# Patient Record
Sex: Female | Born: 1940 | Race: White | Hispanic: No | Marital: Married | Smoking: Never smoker
Health system: Southern US, Community
[De-identification: ages and names within clinical notes are randomized; demographics above are authoritative.]

## PROBLEM LIST (undated history)

## (undated) DIAGNOSIS — M1990 Unspecified osteoarthritis, unspecified site: Secondary | ICD-10-CM

## (undated) DIAGNOSIS — L409 Psoriasis, unspecified: Secondary | ICD-10-CM

## (undated) DIAGNOSIS — M069 Rheumatoid arthritis, unspecified: Secondary | ICD-10-CM

## (undated) HISTORY — PX: ABDOMINAL HYSTERECTOMY: SHX81

---

## 2015-05-13 ENCOUNTER — Emergency Department: Payer: Medicare Other

## 2015-05-13 ENCOUNTER — Encounter: Payer: Self-pay | Admitting: Emergency Medicine

## 2015-05-13 ENCOUNTER — Observation Stay
Admission: EM | Admit: 2015-05-13 | Discharge: 2015-05-15 | Disposition: A | Discharge status: Skilled Nursing Facility | Payer: Medicare Other | Attending: Internal Medicine | Admitting: Internal Medicine

## 2015-05-13 DIAGNOSIS — Z885 Allergy status to narcotic agent status: Secondary | ICD-10-CM | POA: Insufficient documentation

## 2015-05-13 DIAGNOSIS — R531 Weakness: Secondary | ICD-10-CM

## 2015-05-13 DIAGNOSIS — Z79899 Other long term (current) drug therapy: Secondary | ICD-10-CM | POA: Diagnosis not present

## 2015-05-13 DIAGNOSIS — M25562 Pain in left knee: Secondary | ICD-10-CM | POA: Diagnosis not present

## 2015-05-13 DIAGNOSIS — M1990 Unspecified osteoarthritis, unspecified site: Secondary | ICD-10-CM | POA: Diagnosis not present

## 2015-05-13 DIAGNOSIS — M79605 Pain in left leg: Secondary | ICD-10-CM | POA: Insufficient documentation

## 2015-05-13 DIAGNOSIS — Y92003 Bedroom of unspecified non-institutional (private) residence as the place of occurrence of the external cause: Secondary | ICD-10-CM | POA: Diagnosis not present

## 2015-05-13 DIAGNOSIS — R0602 Shortness of breath: Secondary | ICD-10-CM | POA: Diagnosis not present

## 2015-05-13 DIAGNOSIS — R52 Pain, unspecified: Secondary | ICD-10-CM | POA: Diagnosis present

## 2015-05-13 DIAGNOSIS — Z8249 Family history of ischemic heart disease and other diseases of the circulatory system: Secondary | ICD-10-CM | POA: Insufficient documentation

## 2015-05-13 DIAGNOSIS — R05 Cough: Secondary | ICD-10-CM | POA: Diagnosis not present

## 2015-05-13 DIAGNOSIS — M8580 Other specified disorders of bone density and structure, unspecified site: Secondary | ICD-10-CM | POA: Diagnosis not present

## 2015-05-13 DIAGNOSIS — S92315A Nondisplaced fracture of first metatarsal bone, left foot, initial encounter for closed fracture: Secondary | ICD-10-CM | POA: Diagnosis not present

## 2015-05-13 DIAGNOSIS — S82302A Unspecified fracture of lower end of left tibia, initial encounter for closed fracture: Secondary | ICD-10-CM | POA: Insufficient documentation

## 2015-05-13 DIAGNOSIS — M069 Rheumatoid arthritis, unspecified: Secondary | ICD-10-CM | POA: Diagnosis not present

## 2015-05-13 DIAGNOSIS — N390 Urinary tract infection, site not specified: Secondary | ICD-10-CM | POA: Diagnosis present

## 2015-05-13 DIAGNOSIS — J189 Pneumonia, unspecified organism: Secondary | ICD-10-CM | POA: Insufficient documentation

## 2015-05-13 DIAGNOSIS — L409 Psoriasis, unspecified: Secondary | ICD-10-CM | POA: Insufficient documentation

## 2015-05-13 DIAGNOSIS — S72402A Unspecified fracture of lower end of left femur, initial encounter for closed fracture: Secondary | ICD-10-CM | POA: Insufficient documentation

## 2015-05-13 DIAGNOSIS — W19XXXA Unspecified fall, initial encounter: Secondary | ICD-10-CM | POA: Insufficient documentation

## 2015-05-13 DIAGNOSIS — S82209A Unspecified fracture of shaft of unspecified tibia, initial encounter for closed fracture: Secondary | ICD-10-CM | POA: Diagnosis present

## 2015-05-13 DIAGNOSIS — Z90710 Acquired absence of both cervix and uterus: Secondary | ICD-10-CM | POA: Insufficient documentation

## 2015-05-13 DIAGNOSIS — S92355A Nondisplaced fracture of fifth metatarsal bone, left foot, initial encounter for closed fracture: Secondary | ICD-10-CM | POA: Insufficient documentation

## 2015-05-13 DIAGNOSIS — M7989 Other specified soft tissue disorders: Secondary | ICD-10-CM

## 2015-05-13 DIAGNOSIS — S92309A Fracture of unspecified metatarsal bone(s), unspecified foot, initial encounter for closed fracture: Secondary | ICD-10-CM

## 2015-05-13 DIAGNOSIS — S82202A Unspecified fracture of shaft of left tibia, initial encounter for closed fracture: Secondary | ICD-10-CM

## 2015-05-13 HISTORY — DX: Unspecified osteoarthritis, unspecified site: M19.90

## 2015-05-13 HISTORY — DX: Psoriasis, unspecified: L40.9

## 2015-05-13 HISTORY — DX: Rheumatoid arthritis, unspecified: M06.9

## 2015-05-13 LAB — CBC WITH DIFFERENTIAL/PLATELET
BASOS ABS: 0.1 10*3/uL (ref 0–0.1)
BASOS PCT: 1 %
Eosinophils Absolute: 0.4 10*3/uL (ref 0–0.7)
Eosinophils Relative: 4 %
HEMATOCRIT: 35.9 % (ref 35.0–47.0)
HEMOGLOBIN: 11.3 g/dL — AB (ref 12.0–16.0)
LYMPHS PCT: 27 %
Lymphs Abs: 3 10*3/uL (ref 1.0–3.6)
MCH: 29.6 pg (ref 26.0–34.0)
MCHC: 31.5 g/dL — ABNORMAL LOW (ref 32.0–36.0)
MCV: 94.1 fL (ref 80.0–100.0)
Monocytes Absolute: 1.1 10*3/uL — ABNORMAL HIGH (ref 0.2–0.9)
Monocytes Relative: 10 %
Neutro Abs: 6.6 10*3/uL — ABNORMAL HIGH (ref 1.4–6.5)
Neutrophils Relative %: 58 %
Platelets: 240 10*3/uL (ref 150–440)
RBC: 3.81 MIL/uL (ref 3.80–5.20)
RDW: 16.6 % — AB (ref 11.5–14.5)
WBC: 11.3 10*3/uL — AB (ref 3.6–11.0)

## 2015-05-13 LAB — COMPREHENSIVE METABOLIC PANEL
ALBUMIN: 3.3 g/dL — AB (ref 3.5–5.0)
ALK PHOS: 132 U/L — AB (ref 38–126)
ALT: 20 U/L (ref 14–54)
AST: 28 U/L (ref 15–41)
Anion gap: 7 (ref 5–15)
BILIRUBIN TOTAL: 0.5 mg/dL (ref 0.3–1.2)
BUN: 18 mg/dL (ref 6–20)
CALCIUM: 8.8 mg/dL — AB (ref 8.9–10.3)
CHLORIDE: 109 mmol/L (ref 101–111)
CO2: 23 mmol/L (ref 22–32)
Creatinine, Ser: 0.96 mg/dL (ref 0.44–1.00)
GFR calc Af Amer: >60 mL/min (ref >60)
GFR, EST NON AFRICAN AMERICAN: 57 mL/min — AB (ref >60)
Glucose, Bld: 120 mg/dL — ABNORMAL HIGH (ref 65–99)
POTASSIUM: 4.2 mmol/L (ref 3.5–5.1)
Sodium: 139 mmol/L (ref 135–145)
TOTAL PROTEIN: 7.1 g/dL (ref 6.5–8.1)

## 2015-05-13 LAB — URINALYSIS COMPLETE WITH MICROSCOPIC (ARMC ONLY)
Bilirubin Urine: NEGATIVE
Glucose, UA: NEGATIVE mg/dL
Hgb urine dipstick: NEGATIVE
NITRITE: NEGATIVE
PH: 5 (ref 5.0–8.0)
Protein, ur: NEGATIVE mg/dL
SPECIFIC GRAVITY, URINE: 1.01 (ref 1.005–1.030)

## 2015-05-13 LAB — TROPONIN I: TROPONIN I: 0.03 ng/mL (ref <0.031)

## 2015-05-13 MED ORDER — LEVOFLOXACIN IN D5W 750 MG/150ML IV SOLN: 750.0000 mg | INTRAVENOUS | Status: DC

## 2015-05-13 MED ORDER — ACETAMINOPHEN 650 MG RE SUPP: 650.0000 mg | Freq: Four times a day (QID) | RECTAL | Status: DC | PRN

## 2015-05-13 MED ORDER — ONDANSETRON HCL 4 MG/2ML IJ SOLN
4.0000 mg | Freq: Four times a day (QID) | INTRAMUSCULAR | Status: DC | PRN
  Administered 2015-05-14: 4 mg via INTRAVENOUS
  Filled 2015-05-13: qty 2

## 2015-05-13 MED ORDER — HYDROMORPHONE HCL 1 MG/ML IJ SOLN
1.0000 mg | INTRAMUSCULAR | Status: DC | PRN
  Administered 2015-05-13 – 2015-05-15 (×7): 1 mg via INTRAVENOUS
  Filled 2015-05-13 (×7): qty 1

## 2015-05-13 MED ORDER — HYDROMORPHONE HCL 1 MG/ML IJ SOLN
INTRAMUSCULAR | Status: AC
  Administered 2015-05-13: 0.5 mg via INTRAVENOUS
  Filled 2015-05-13: qty 1

## 2015-05-13 MED ORDER — ONDANSETRON HCL 4 MG PO TABS: 4.0000 mg | ORAL_TABLET | Freq: Four times a day (QID) | ORAL | Status: DC | PRN

## 2015-05-13 MED ORDER — SODIUM CHLORIDE 0.9 % IV BOLUS (SEPSIS)
1000.0000 mL | Freq: Once | INTRAVENOUS | Status: AC
  Administered 2015-05-13: 1000 mL via INTRAVENOUS

## 2015-05-13 MED ORDER — LEVOFLOXACIN IN D5W 750 MG/150ML IV SOLN
INTRAVENOUS | Status: AC
  Administered 2015-05-13: 750 mg via INTRAVENOUS
  Filled 2015-05-13: qty 150

## 2015-05-13 MED ORDER — LEVOFLOXACIN IN D5W 750 MG/150ML IV SOLN
750.0000 mg | INTRAVENOUS | Status: DC
  Administered 2015-05-13: 750 mg via INTRAVENOUS

## 2015-05-13 MED ORDER — HYDROMORPHONE HCL 1 MG/ML IJ SOLN
0.5000 mg | Freq: Once | INTRAMUSCULAR | Status: AC
  Administered 2015-05-13: 0.5 mg via INTRAVENOUS

## 2015-05-13 MED ORDER — ACETAMINOPHEN 325 MG PO TABS: 650.0000 mg | ORAL_TABLET | Freq: Four times a day (QID) | ORAL | Status: DC | PRN

## 2015-05-13 MED ORDER — ENOXAPARIN SODIUM 40 MG/0.4ML ~~LOC~~ SOLN
40.0000 mg | SUBCUTANEOUS | Status: DC
  Administered 2015-05-13 – 2015-05-14 (×2): 40 mg via SUBCUTANEOUS
  Filled 2015-05-13 (×2): qty 0.4

## 2015-05-13 MED ORDER — IPRATROPIUM-ALBUTEROL 0.5-2.5 (3) MG/3ML IN SOLN: 3.0000 mL | RESPIRATORY_TRACT | Status: DC | PRN

## 2015-05-13 NOTE — ED Provider Notes (Signed)
Patient unable to ambulate, cannot go home. We'll need skilled rehabilitation placement. Social work will be consult, patient be started on antibiotics treatment UTI.Medical screening examination/treatment/procedure(s) were performed by non-physician practitioner and as supervising physician I was immediately available for consultation/collaboration.    Emily Filbert, MD 05/13/15 1900

## 2015-05-13 NOTE — ED Notes (Signed)
Obtained specimen by in and out cath.  Did complete bed change and cleaned patient

## 2015-05-13 NOTE — ED Notes (Signed)
Patient transported to CT 

## 2015-05-13 NOTE — ED Notes (Signed)
Assisted patient with using the bathroom

## 2015-05-13 NOTE — ED Notes (Signed)
Assisted patient with bedpan 

## 2015-05-13 NOTE — ED Notes (Signed)
Ems pt from home , pt slipped , fell injurying left foot and left knee outward rotation noted to left foot , pt denies left hip pain , pt received a total of fentanyl IV , and 4 mg IV zofran . Pt with increased pain on palpation to left foot and knee

## 2015-05-13 NOTE — H&P (Signed)
[facility] [place] at [facility]   PATIENT NAME: Shirley Howell    MR#:  [id number]  DATE OF BIRTH:  1940/12/18  DATE OF ADMISSION:  05/13/2015  PRIMARY CARE PHYSICIAN: Thea Alken, MD   REQUESTING/REFERRING PHYSICIAN: Dr. Daryel November  CHIEF COMPLAINT:   Chief Complaint  Patient presents with  . Fall   status post recent fall and left tibial fracture and difficulty walking  HISTORY OF PRESENT ILLNESS:  Shirley Howell  is a 74 y.o. female with a known history of osteoarthritis, rheumatoid arthritis,  psoriasis presented to the hospital after suffering a mechanical fall in her bedroom earlier today and was having significant left lower extremity pain and therefore came to the ER for further evaluation. In the emergency room patient underwent x-ray of her left leg including a CT scan which showed evidence of a left-sided tibial fracture, multiple left foot metatarsal fractures. Patient is having difficulty ambulating and therefore is being admitted to hospital for further evaluation. Incidentally patient has also had a cough with a productive sputum which is green and yellow in color. Patient has been on a Z-Pak and finished it about a week ago without much improvement in his symptoms. On x-ray patient is noted to have [place] superimposed pneumonia.  PAST MEDICAL HISTORY:   Past Medical History  Diagnosis Date  . Arthritis   . Rheumatoid arthritis   . Psoriasis     PAST SURGICAL HISTORY:   Past Surgical History  Procedure Laterality Date  . Abdominal hysterectomy      SOCIAL HISTORY:   History  Substance Use Topics  . Smoking status: Never Smoker   . Smokeless tobacco: Not on file  . Alcohol Use: No    FAMILY HISTORY:   Family History  Problem Relation Age of Onset  . Heart failure Mother   . Heart failure Father     DRUG ALLERGIES:   Allergies  Allergen Reactions  . Codeine Anxiety    REVIEW OF SYSTEMS:   Review of  Systems  Constitutional: Negative for fever and weight loss.  HENT: Negative for congestion, nosebleeds and tinnitus.   Eyes: Negative for blurred vision, double vision and redness.  Respiratory: Positive for cough, sputum production (green-yellow in color) and wheezing. Negative for hemoptysis and shortness of breath.   Cardiovascular: Negative for chest pain, orthopnea, leg swelling and PND.  Gastrointestinal: Negative for nausea, vomiting, abdominal pain, diarrhea and melena.  Genitourinary: Negative for dysuria, urgency and hematuria.  Musculoskeletal: Negative for joint pain and falls.  Neurological: Negative for dizziness, tingling, sensory change, focal weakness, seizures, weakness and headaches.  Endo/Heme/Allergies: Negative for polydipsia. Does not bruise/bleed easily.  Psychiatric/Behavioral: Negative for depression and memory loss. The patient is not nervous/anxious.     MEDICATIONS AT HOME:   Prior to Admission medications   Not on File   Med Rec. Is not done and will reorder home meds once it has been completed.     VITAL SIGNS:  Blood pressure 138/59, pulse 86, temperature 98.4 F (36.9 C), temperature source Oral, resp. rate 20, height 5' (1.524 m), weight 80.287 kg (177 lb), SpO2 98 %.  PHYSICAL EXAMINATION:  Physical Exam  GENERAL:  74 y.o.-year-old patient lying in the bed with no acute distress.  EYES: Pupils equal, round, reactive to light and accommodation. No scleral icterus. Extraocular muscles intact.  HEENT: Head atraumatic, normocephalic. Oropharynx and nasopharynx clear. No oropharyngeal erythema, moist oral mucosa  NECK:  Supple, no jugular venous  distention. No thyroid enlargement, no tenderness.  LUNGS: Diffuse rhonchi, wheezing bilaterally. No dullness to percussion. No use of accessory muscles of respiration.  CARDIOVASCULAR: S1, S2 regular rate and rhythm. No murmurs, rubs, or gallops.  ABDOMEN: Soft, nontender, nondistended. Bowel sounds present.  No organomegaly or mass.  EXTREMITIES: No pedal edema, cyanosis, or clubbing. + 2 pedal & radial pulses b/l.  Left lower extremity in a cast NEUROLOGIC: Cranial nerves II through XII are intact. No focal Motor or sensory deficits appreciated b/l PSYCHIATRIC: The patient is alert and oriented x 3. Good affect.  SKIN: No obvious rash, lesion, or ulcer.   LABORATORY PANEL:   CBC  Recent Labs Lab 05/13/15 1715  WBC 11.3*  HGB 11.3*  HCT 35.9  PLT 240   ------------------------------------------------------------------------------------------------------------------  Chemistries   Recent Labs Lab 05/13/15 1715  NA 139  K 4.2  CL 109  CO2 23  GLUCOSE 120*  BUN 18  CREATININE 0.96  CALCIUM 8.8*  AST 28  ALT 20  ALKPHOS 132*  BILITOT 0.5   ------------------------------------------------------------------------------------------------------------------  Cardiac Enzymes  Recent Labs Lab 05/13/15 1715  TROPONINI 0.03   ------------------------------------------------------------------------------------------------------------------  RADIOLOGY:  Dg Chest 1 View  05/13/2015   CLINICAL DATA:  Cough for 2 weeks.  Shortness of breath.  EXAM: CHEST  1 VIEW  COMPARISON:  None.  FINDINGS: There is cardiomegaly. Pulmonary interstitium appears coarsened and distorted. Somewhat focal airspace opacity is identified in the right upper lung zone. No pneumothorax or pleural effusion is identified. Remote surgical neck fracture right humerus is noted.  IMPRESSION: Chronic interstitial change. Patchy airspace opacity right upper lung zone could be due to superimposed pneumonia. Recommend followup to clearing.   Electronically Signed   By: Drusilla Kanner M.D.   On: 05/13/2015 20:16   Dg Ankle Complete Left  05/13/2015   CLINICAL DATA:  Fall  EXAM: LEFT ANKLE COMPLETE - 3+ VIEW  COMPARISON:  None.  FINDINGS: Severe osteopenia. There is an oblique fracture which is minimally displaced in  the distal tibia, 5 cm above the tibial plafond. There is also a subtle fracture at the base of the first metatarsal. It is ill-defined and is across the epiphysis. There is a fracture through the base of the fifth metatarsal which traverses into its articulation with the cuboid. The edges are somewhat corticated in this is probably subacute or chronic.  IMPRESSION: Multiple fractures of indeterminate age involving the distal tibia, base of the first metatarsal, and base of the fifth metatarsal. Foot radiograph study may be helpful.  Osteopenia   Electronically Signed   By: Jolaine Click M.D.   On: 05/13/2015 13:38   Ct Femur Left Wo Contrast  05/13/2015   CLINICAL DATA:  Probable subacute or old impaction fracture of the distal left femur on radiographs earlier today. The patient hurt the left knee after falling.  EXAM: CT OF THE LEFT FEMUR WITHOUT CONTRAST  TECHNIQUE: Multidetector CT imaging was performed according to the standard protocol. Multiplanar CT image reconstructions were also generated.  COMPARISON:  Left knee radiographs obtained earlier today.  FINDINGS: Marked diffuse osteopenia. No fracture or dislocation seen. Left knee tricompartmental degenerative changes. Small to moderate-sized knee effusion. Left lateral subcutaneous edema in the distal thigh. The posterior cruciate ligament appears intact. The anterior cruciate ligament is not adequately visualized. Atheromatous arterial calcifications are noted.  IMPRESSION: 1. No fracture or dislocation seen. 2. Small to moderate-sized knee joint effusion. 3. Left knee tricompartmental degenerative changes. 4. Left lateral subcutaneous edema  in the distal thigh. 5. Marked diffuse osteopenia.   Electronically Signed   By: Beckie Salts M.D.   On: 05/13/2015 17:15   Dg Knee Complete 4 Views Left  05/13/2015   CLINICAL DATA:  Status post fall today. Left knee pain. Initial encounter.  EXAM: LEFT KNEE - COMPLETE 4+ VIEW  COMPARISON:  None.  FINDINGS: There  is an age-indeterminate impaction fracture of the distal femur. The fracture appears subacute to remote. No other fracture is identified. Degenerative change about the knee appears worst in medial compartment. There is no joint effusion. Patella alta is noted.  IMPRESSION: Mild impaction fracture of the distal left femur appears subacute to remote.  Osteopenia.  Osteoarthritis most notable medially.  Patella alta.   Electronically Signed   By: Drusilla Kanner M.D.   On: 05/13/2015 14:14   Dg Foot Complete Left  05/13/2015   CLINICAL DATA:  Status post fall today. Left foot pain. Initial encounter.  EXAM: LEFT FOOT - COMPLETE 3+ VIEW  COMPARISON:  None.  FINDINGS: Bones are severely osteopenic. There is a nondisplaced fracture at the base of the fifth metatarsal. Fracture of the base of first metatarsal is also identified. There may also be fractures through the necks of the second and third metatarsals.  IMPRESSION: Nondisplaced fractures of the bases of the first and fifth metatarsals and likely fractures of the necks of the second and third metatarsals are age indeterminate.  Severe osteopenia.   Electronically Signed   By: Drusilla Kanner M.D.   On: 05/13/2015 14:12     IMPRESSION AND PLAN:   74 year old female with past medical history of psoriasis, osteoarthritis, rheumatoid arthritis who presents to the hospital after suffering a mechanical fall and noted to have a left tibial and multiple metatarsal fractures on the left foot. Incidentally patient was also noted to have a pneumonia.  #1 left foot metatarsal fractures/left tibial fracture-this is likely secondary to the mechanical fall. We'll get orthopedic consult. Patient's leg is already in a cast. Pain control with as needed Dilaudid. We'll get physical therapy evaluation and patient may likely need short-term rehabilitation.  #2 pneumonia-patient has had a cough with productive sputum now for about a week and has finished a Z-Pak  recently. Chest x-ray on admission showing a possible right upper lobe airspace disease consistent with pneumonia. We'll start the patient on IV Levaquin and follow sputum cultures. She is afebrile and hemodynamically stable.  #3 history of rheumatoid arthritis/psoriasis-we'll resume patient's home meds once the medication reconciliation is done.    All the records are reviewed and case discussed with ED provider. Management plans discussed with the patient, family and they are in agreement.  CODE STATUS: Full  TOTAL TIME TAKING CARE OF THIS PATIENT: 50 minutes.    Houston Siren M.D on 05/13/2015 at 8:33 PM  Between 7am to 6pm - Pager - [phone]  After 6pm go to [email or web address] - password [email or web address] [facility]  [facility]  Office  [phone]  CC: Primary care physician; Thea Alken, MD

## 2015-05-13 NOTE — ED Notes (Signed)
MD at bedside. 

## 2015-05-13 NOTE — ED Notes (Signed)
X-ray at bedside

## 2015-05-13 NOTE — ED Provider Notes (Signed)
[facility] Emergency Department Provider Note  ____________________________________________  Time seen: Approximately 12:36 PM  I have reviewed the triage vital signs and the nursing notes.   HISTORY  Chief Complaint Fall    HPI Shirley Howell is a 74 y.o. female who slipped on carpet and fell this morning. She states her right foot slipped and and her left foot and leg bent backward, and then she landed on the bent foot. She is also complaining of pain to the left knee.   Past Medical History  Diagnosis Date  . Arthritis     There are no active problems to display for this patient.   Past Surgical History  Procedure Laterality Date  . Abdominal hysterectomy      No current outpatient prescriptions on file.  Allergies Codeine  No family history on file.  Social History History  Substance Use Topics  . Smoking status: Never Smoker   . Smokeless tobacco: Not on file  . Alcohol Use: Not on file    Review of Systems Constitutional: No recent illness. Eyes: No visual changes. ENT: No sore throat. Cardiovascular: Denies chest pain or palpitations. Respiratory: Denies shortness of breath. Gastrointestinal: No abdominal pain.  Genitourinary: Negative for dysuria. Musculoskeletal: Pain in left foot, ankle, and knee. Skin: Negative for rash. Neurological: Negative for headaches, focal weakness or numbness. 10-point ROS otherwise negative.  ____________________________________________   PHYSICAL EXAM:  VITAL SIGNS: ED Triage Vitals  Enc Vitals Group     BP 05/13/15 1230 179/79 mmHg     Pulse Rate 05/13/15 1230 79     Resp 05/13/15 1230 20     Temp 05/13/15 1230 97.9 F (36.6 C)     Temp Source 05/13/15 1230 Oral     SpO2 05/13/15 1226 98 %     Weight 05/13/15 1230 177 lb (80.287 kg)     Height 05/13/15 1230 5' (1.524 m)     Head Cir --      Peak Flow --      Pain Score 05/13/15 1231 10     Pain Loc --      Pain Edu? --      Excl. in GC? --     Constitutional: Alert and oriented. Well appearing and in no acute distress. Eyes: Conjunctivae are normal. EOMI. Head: Atraumatic. Nose: No congestion/rhinnorhea. Neck: No stridor.  Respiratory: Normal respiratory effort.   Musculoskeletal: Obvious deformity to left ankle. Tenderness to palpation over proximal fibula. Neurologic:  Normal speech and language. No gross focal neurologic deficits are appreciated. Speech is normal. No gait instability. Skin:  Skin is warm, dry and intact. Atraumatic. No open lesions noted Psychiatric: Mood and affect are normal. Speech and behavior are normal.  ____________________________________________   LABS (all labs ordered are listed, but only abnormal results are displayed)  Labs Reviewed - No data to display ____________________________________________  RADIOLOGY  Nondisplaced fractures of the first and fifth metatarsal, questionable fractures through the next of the second and third metatarsal; impaction fracture of the distal femur is present, possibly subacute; multiple fractures of the distal tibia. ____________________________________________   PROCEDURES  Procedure(s) performed: SPLINT APPLICATION Date/Time: 7:14 PM Authorized by: Kem Boroughs Consent: Verbal consent obtained. Risks and benefits: risks, benefits and alternatives were discussed Consent given by: patient Splint applied XI:PJASN, tech Location details: left posterior toes to mid calf Splint type: OCL Supplies used: OCL/Ace Post-procedure: The splinted body part was neurovascularly unchanged following the procedure. Patient tolerance: Patient tolerated the procedure well with  no immediate complications.      ____________________________________________   INITIAL IMPRESSION / ASSESSMENT AND PLAN / ED COURSE  Pertinent labs & imaging results that were available during my care of the patient were reviewed by me and considered in my  medical decision making (see chart for details).  Will manage pain and await x-ray results. ----------------------------------------- 3:30 PM on 05/13/2015 -----------------------------------------  X-rays viewed by me. Discussed with Dr. Hyacinth Meeker. Awaiting return call for plan and disposition.  3:56 PM  Plan to CT to get better look at femur/confirm fracture. Will apply posterior ocl to toes/ankle. ----------------------------------------- 7:18 PM on 05/13/2015 -----------------------------------------  Patient and family in room. Discussed the possibility of being discharged home. Patient husband, and other family are unable to care for her at home. She will need to stay in the hospital for skilled nursing and rehabilitation placement.     ____________________________________________   FINAL CLINICAL IMPRESSION(S) / ED DIAGNOSES  Final diagnoses:  Pain and swelling of left lower extremity       Chinita Pester, FNP 05/13/15 1921  Governor Rooks, MD 05/14/15 1447

## 2015-05-13 NOTE — ED Notes (Signed)
Family at bedside. 

## 2015-05-13 NOTE — Progress Notes (Signed)
Pts. Home meds are in pharmacy.

## 2015-05-14 LAB — CBC
HCT: 34.5 % — ABNORMAL LOW (ref 35.0–47.0)
Hemoglobin: 11.2 g/dL — ABNORMAL LOW (ref 12.0–16.0)
MCH: 30.7 pg (ref 26.0–34.0)
MCHC: 32.6 g/dL (ref 32.0–36.0)
MCV: 94.2 fL (ref 80.0–100.0)
Platelets: 222 10*3/uL (ref 150–440)
RBC: 3.66 MIL/uL — ABNORMAL LOW (ref 3.80–5.20)
RDW: 15.8 % — ABNORMAL HIGH (ref 11.5–14.5)
WBC: 8.7 10*3/uL (ref 3.6–11.0)

## 2015-05-14 LAB — BASIC METABOLIC PANEL
ANION GAP: 5 (ref 5–15)
BUN: 14 mg/dL (ref 6–20)
CALCIUM: 8.3 mg/dL — AB (ref 8.9–10.3)
CO2: 24 mmol/L (ref 22–32)
Chloride: 109 mmol/L (ref 101–111)
Creatinine, Ser: 0.83 mg/dL (ref 0.44–1.00)
GFR calc Af Amer: >60 mL/min (ref >60)
GFR calc non Af Amer: >60 mL/min (ref >60)
GLUCOSE: 115 mg/dL — AB (ref 65–99)
Potassium: 4.3 mmol/L (ref 3.5–5.1)
SODIUM: 138 mmol/L (ref 135–145)

## 2015-05-14 MED ORDER — LEVOFLOXACIN IN D5W 750 MG/150ML IV SOLN
750.0000 mg | INTRAVENOUS | Status: DC
  Administered 2015-05-14: 750 mg via INTRAVENOUS
  Filled 2015-05-14 (×3): qty 150

## 2015-05-14 NOTE — Progress Notes (Signed)
[facility] - [place] at [facility]                                                                                                                                                                                            Patient Demographics   Shirley Howell, is a 74 y.o. female, DOB - [birth year], YTK:[id number]  Admit date - 05/13/2015   Admitting Physician Houston Siren, MD  Outpatient Primary MD for the patient is Thea Alken, MD   LOS -   Subjective: Patient still has significant pain in the left leg, continues to have complaint of cough. And some shortness of breath     Review of Systems:   CONSTITUTIONAL: No documented fever. No fatigue, weakness. No weight gain, no weight loss.  EYES: No blurry or double vision.  ENT: No tinnitus. No postnasal drip. No redness of the oropharynx.  RESPIRATORY: positive cough, no wheeze, no hemoptysis. possive dyspnea.  CARDIOVASCULAR: No chest pain. No orthopnea. No palpitations. No syncope.  GASTROINTESTINAL: No nausea, no vomiting or diarrhea. No abdominal pain. No melena or hematochezia.  GENITOURINARY: No dysuria or hematuria.  ENDOCRINE: No polyuria or nocturia. No heat or cold intolerance.  HEMATOLOGY: No anemia. No bruising. No bleeding.  INTEGUMENTARY: No rashes. No lesions.  MUSCULOSKELETAL: No arthritis. No swelling. No gout. Pain in the left foot NEUROLOGIC: No numbness, tingling, or ataxia. No seizure-type activity.  PSYCHIATRIC: No anxiety. No insomnia. No ADD.    Vitals:   Filed Vitals:   05/13/15 2027 05/13/15 2152 05/14/15 0444 05/14/15 0750  BP: 138/59 144/57 132/59 150/64  Pulse: 86 78 78 80  Temp: 98.4 F (36.9 C) 97.4 F (36.3 C) 98.4 F (36.9 C) 98.3 F (36.8 C)  TempSrc: Oral Oral Oral Oral  Resp: 20 18 18 18   Height:      Weight:      SpO2: 98% 98% 94% 98%    Wt Readings from Last 3 Encounters:  05/13/15 80.287 kg (177 lb)    No intake or output data in the 24  hours ending 05/14/15 1213  Physical Exam:   GENERAL: Pleasant-appearing in no apparent distress.  HEAD, EYES, EARS, NOSE AND THROAT: Atraumatic, normocephalic. Extraocular muscles are intact. Pupils equal and reactive to light. Sclerae anicteric. No conjunctival injection. No oro-pharyngeal erythema.  NECK: Supple. There is no jugular venous distention. No bruits, no lymphadenopathy, no thyromegaly.  HEART: Regular rate and rhythm, tachycardic. No murmurs, no rubs, no clicks.  LUNGS: Clear to auscultation bilaterally. No rales or rhonchi. No wheezes.  ABDOMEN: Soft, flat, nontender, nondistended. Has good bowel sounds. No hepatosplenomegaly  appreciated.  EXTREMITIES: No evidence of any cyanosis, clubbing, or peripheral edema.  +2 pedal and radial pulses bilaterally.  NEUROLOGIC: The patient is alert, awake, and oriented x3 with no focal motor or sensory deficits appreciated bilaterally.  SKIN: Moist and warm with no rashes appreciated.  Psych: Not anxious, depressed LN: No inguinal LN enlargement    Antibiotics   Anti-infectives    Start     Dose/Rate Route Frequency Ordered Stop   05/15/15 1800  levofloxacin (LEVAQUIN) IVPB 750 mg  Status:  Discontinued     750 mg 100 mL/hr over 90 Minutes Intravenous Every 48 hours 05/13/15 2240 05/14/15 1029   05/14/15 2100  levofloxacin (LEVAQUIN) IVPB 750 mg     750 mg 100 mL/hr over 90 Minutes Intravenous Every 24 hours 05/14/15 1029     05/13/15 2045  levofloxacin (LEVAQUIN) IVPB 750 mg  Status:  Discontinued     750 mg 100 mL/hr over 90 Minutes Intravenous Every 24 hours 05/13/15 2035 05/13/15 2240      Medications   Scheduled Meds: . enoxaparin (LOVENOX) injection  40 mg Subcutaneous Q24H  . levofloxacin (LEVAQUIN) IV  750 mg Intravenous Q24H   Continuous Infusions:  PRN Meds:.acetaminophen **OR** acetaminophen, HYDROmorphone (DILAUDID) injection, ipratropium-albuterol, ondansetron **OR** ondansetron (ZOFRAN) IV   Data Review:    Micro Results No results found for this or any previous visit (from the past 240 hour(s)).  Radiology Reports Dg Chest 1 View  05/13/2015   CLINICAL DATA:  Cough for 2 weeks.  Shortness of breath.  EXAM: CHEST  1 VIEW  COMPARISON:  None.  FINDINGS: There is cardiomegaly. Pulmonary interstitium appears coarsened and distorted. Somewhat focal airspace opacity is identified in the right upper lung zone. No pneumothorax or pleural effusion is identified. Remote surgical neck fracture right humerus is noted.  IMPRESSION: Chronic interstitial change. Patchy airspace opacity right upper lung zone could be due to superimposed pneumonia. Recommend followup to clearing.   Electronically Signed   By: Drusilla Kanner M.D.   On: 05/13/2015 20:16   Dg Ankle Complete Left  05/13/2015   CLINICAL DATA:  Fall  EXAM: LEFT ANKLE COMPLETE - 3+ VIEW  COMPARISON:  None.  FINDINGS: Severe osteopenia. There is an oblique fracture which is minimally displaced in the distal tibia, 5 cm above the tibial plafond. There is also a subtle fracture at the base of the first metatarsal. It is ill-defined and is across the epiphysis. There is a fracture through the base of the fifth metatarsal which traverses into its articulation with the cuboid. The edges are somewhat corticated in this is probably subacute or chronic.  IMPRESSION: Multiple fractures of indeterminate age involving the distal tibia, base of the first metatarsal, and base of the fifth metatarsal. Foot radiograph study may be helpful.  Osteopenia   Electronically Signed   By: Jolaine Click M.D.   On: 05/13/2015 13:38   Ct Femur Left Wo Contrast  05/13/2015   CLINICAL DATA:  Probable subacute or old impaction fracture of the distal left femur on radiographs earlier today. The patient hurt the left knee after falling.  EXAM: CT OF THE LEFT FEMUR WITHOUT CONTRAST  TECHNIQUE: Multidetector CT imaging was performed according to the standard protocol. Multiplanar CT image  reconstructions were also generated.  COMPARISON:  Left knee radiographs obtained earlier today.  FINDINGS: Marked diffuse osteopenia. No fracture or dislocation seen. Left knee tricompartmental degenerative changes. Small to moderate-sized knee effusion. Left lateral subcutaneous edema in the distal thigh.  The posterior cruciate ligament appears intact. The anterior cruciate ligament is not adequately visualized. Atheromatous arterial calcifications are noted.  IMPRESSION: 1. No fracture or dislocation seen. 2. Small to moderate-sized knee joint effusion. 3. Left knee tricompartmental degenerative changes. 4. Left lateral subcutaneous edema in the distal thigh. 5. Marked diffuse osteopenia.   Electronically Signed   By: Beckie Salts M.D.   On: 05/13/2015 17:15   Dg Knee Complete 4 Views Left  05/13/2015   CLINICAL DATA:  Status post fall today. Left knee pain. Initial encounter.  EXAM: LEFT KNEE - COMPLETE 4+ VIEW  COMPARISON:  None.  FINDINGS: There is an age-indeterminate impaction fracture of the distal femur. The fracture appears subacute to remote. No other fracture is identified. Degenerative change about the knee appears worst in medial compartment. There is no joint effusion. Patella alta is noted.  IMPRESSION: Mild impaction fracture of the distal left femur appears subacute to remote.  Osteopenia.  Osteoarthritis most notable medially.  Patella alta.   Electronically Signed   By: Drusilla Kanner M.D.   On: 05/13/2015 14:14   Dg Foot Complete Left  05/13/2015   CLINICAL DATA:  Status post fall today. Left foot pain. Initial encounter.  EXAM: LEFT FOOT - COMPLETE 3+ VIEW  COMPARISON:  None.  FINDINGS: Bones are severely osteopenic. There is a nondisplaced fracture at the base of the fifth metatarsal. Fracture of the base of first metatarsal is also identified. There may also be fractures through the necks of the second and third metatarsals.  IMPRESSION: Nondisplaced fractures of the bases of the  first and fifth metatarsals and likely fractures of the necks of the second and third metatarsals are age indeterminate.  Severe osteopenia.   Electronically Signed   By: Drusilla Kanner M.D.   On: 05/13/2015 14:12     CBC  Recent Labs Lab 05/13/15 1715 05/14/15 0353  WBC 11.3* 8.7  HGB 11.3* 11.2*  HCT 35.9 34.5*  PLT 240 222  MCV 94.1 94.2  MCH 29.6 30.7  MCHC 31.5* 32.6  RDW 16.6* 15.8*  LYMPHSABS 3.0  --   MONOABS 1.1*  --   EOSABS 0.4  --   BASOSABS 0.1  --     Chemistries   Recent Labs Lab 05/13/15 1715 05/14/15 0353  NA 139 138  K 4.2 4.3  CL 109 109  CO2 23 24  GLUCOSE 120* 115*  BUN 18 14  CREATININE 0.96 0.83  CALCIUM 8.8* 8.3*  AST 28  --   ALT 20  --   ALKPHOS 132*  --   BILITOT 0.5  --    ------------------------------------------------------------------------------------------------------------------ estimated creatinine clearance is 56.6 mL/min (by C-G formula based on Cr of 0.83). ------------------------------------------------------------------------------------------------------------------ No results for input(s): HGBA1C in the last 72 hours. ------------------------------------------------------------------------------------------------------------------ No results for input(s): CHOL, HDL, LDLCALC, TRIG, CHOLHDL, LDLDIRECT in the last 72 hours. ------------------------------------------------------------------------------------------------------------------ No results for input(s): TSH, T4TOTAL, T3FREE, THYROIDAB in the last 72 hours.  Invalid input(s): FREET3 ------------------------------------------------------------------------------------------------------------------ No results for input(s): VITAMINB12, FOLATE, FERRITIN, TIBC, IRON, RETICCTPCT in the last 72 hours.  Coagulation profile No results for input(s): INR, PROTIME in the last 168 hours.  No results for input(s): DDIMER in the last 72 hours.  Cardiac Enzymes  Recent  Labs Lab 05/13/15 1715  TROPONINI 0.03   ------------------------------------------------------------------------------------------------------------------ Invalid input(s): POCBNP    Assessment & Plan   74 year old female with past medical history of psoriasis, osteoarthritis, rheumatoid arthritis who presents to the hospital after suffering a mechanical fall  and noted to have a left tibial and multiple metatarsal fractures on the left foot. Incidentally patient was also noted to have a pneumonia.  #1 left foot metatarsal fractures/left tibial fracture-this is likely secondary to the mechanical fall. Orthopedic eval currently pending.  #2 pneumonia-patient has had a cough with productive sputum now for about a week and has finished a Z-Pak recently. Chest x-ray on admission showing a possible right upper lobe airspace disease consistent with pneumonia. Continue level Floxin  #3 history of rheumatoid arthritis/psoriasis-Enbrel on hold due to possible intervention     Code Status Orders        Start     Ordered   05/13/15 2159  Full code   Continuous     05/13/15 2158    Advance Directive Documentation        Most Recent Value   Type of Advance Directive  Healthcare Power of Attorney, Living will   Pre-existing out of facility DNR order (yellow form or pink MOST form)     "MOST" Form in Place?             Consults orthopedics DVT Prophylaxis  Lovenox   Lab Results  Component Value Date   PLT 222 05/14/2015     Time Spent in minutes 35 minutes    Auburn Bilberry M.D on 05/14/2015 at 12:13 PM  Between 7am to 6pm - Pager - [phone]  After 6pm go to [email or web address] - password [email or web address] [facility]  [facility]   Office  [phone]

## 2015-05-14 NOTE — Clinical Social Work Placement (Signed)
   CLINICAL SOCIAL WORK PLACEMENT  NOTE  Date:  05/14/2015  Patient Details  Name: Shirley Howell MRN: [id number] Date of Birth: Nov 12, 1941  Clinical Social Work is seeking post-discharge placement for this patient at the Skilled  Nursing Facility level of care (*CSW will initial, date and re-position this form in  chart as items are completed):  Yes   Patient/family provided with [place] Clinical Social Work Department's list of facilities offering this level of care within the geographic area requested by the patient (or if unable, by the patient's family).  Yes   Patient/family informed of their freedom to choose among providers that offer the needed level of care, that participate in Medicare, Medicaid or managed care program needed by the patient, have an available bed and are willing to accept the patient.  Yes   Patient/family informed of [facility]'s ownership interest in [facility] and [facility], as well as of the fact that they are under no obligation to receive care at these facilities.  PASRR submitted to EDS on       PASRR number received on       Existing PASRR number confirmed on 05/14/15     FL2 transmitted to all facilities in geographic area requested by pt/family on 05/14/15     FL2 transmitted to all facilities within larger geographic area on       Patient informed that his/her managed care company has contracts with or will negotiate with certain facilities, including the following:        Yes   Patient/family informed of bed offers received.  Patient chooses bed at [facility]     Physician recommends and patient chooses bed at      Patient to be transferred to   on  .  Patient to be transferred to facility by       Patient family notified on   of transfer.  Name of family member notified:        PHYSICIAN       Additional Comment:    _______________________________________________ Delight Stare,  LCSW 05/14/2015, 1:37 PM

## 2015-05-14 NOTE — Care Management Note (Signed)
Case Management Note  Patient Details  Name: Shirley Howell MRN: [id number] Date of Birth: December 09, 1940  Subjective/Objective:  RNCM assessment for discharge planning. Admitted with left tibia and multiple L metatarsal fractures after falling in her bedroom. HX: rheumatoid arthritis, osteoarthritis.  She has a walker, wheelchair and bedside commode at home. She reports she is dependent on her spouse for adls. Met with pt at bedside. She states she will need to go to rehab at discharge. Her spouse will be having back surgery on 05/23/2015 and she is unable to care for herself.  CSW updated. Following                Action/Plan:   Expected Discharge Date:                  Expected Discharge Plan:  [place]  In-House Referral:  Clinical Social Work  Discharge planning Services  CM Consult  Post Acute Care Choice:    Choice offered to:     DME Arranged:    DME Agency:     HH Arranged:    [place] Agency:     Status of Service:  In process, will continue to follow  Medicare Important Message Given:  Yes Date Medicare IM Given:  05/14/15 Medicare IM give by:  Or[place] July Date Additional Medicare IM Given:    Additional Medicare Important Message give by:     If discussed at [place] of Stay Meetings, dates discussed:    Additional Comments:  Jolly Mango, RN 05/14/2015, 10:28 AM

## 2015-05-14 NOTE — Clinical Social Work Placement (Signed)
   CLINICAL SOCIAL WORK PLACEMENT  NOTE  Date:  05/14/2015  Patient Details  Name: Shirley Howell MRN: [id number] Date of Birth: Jun 25, 1941  Clinical Social Work is seeking post-discharge placement for this patient at the Skilled  Nursing Facility level of care (*CSW will initial, date and re-position this form in  chart as items are completed):  Yes   Patient/family provided with [place] Clinical Social Work Department's list of facilities offering this level of care within the geographic area requested by the patient (or if unable, by the patient's family).  Yes   Patient/family informed of their freedom to choose among providers that offer the needed level of care, that participate in Medicare, Medicaid or managed care program needed by the patient, have an available bed and are willing to accept the patient.  Yes   Patient/family informed of [facility]'s ownership interest in [facility] and [facility], as well as of the fact that they are under no obligation to receive care at these facilities.  PASRR submitted to EDS on       PASRR number received on       Existing PASRR number confirmed on 05/14/15     FL2 transmitted to all facilities in geographic area requested by pt/family on 05/14/15     FL2 transmitted to all facilities within larger geographic area on       Patient informed that his/her managed care company has contracts with or will negotiate with certain facilities, including the following:            Patient/family informed of bed offers received.  Patient chooses bed at       Physician recommends and patient chooses bed at      Patient to be transferred to   on  .  Patient to be transferred to facility by       Patient family notified on   of transfer.  Name of family member notified:        PHYSICIAN       Additional Comment:    _______________________________________________ Delight Stare, LCSW 05/14/2015, 12:23 PM

## 2015-05-14 NOTE — Evaluation (Signed)
Physical Therapy Evaluation Patient Details Name: Shirley Howell MRN: [id number] DOB: 1941/06/19 Today's Date: 05/14/2015   History of Present Illness  Shirley Howell is a 74 y.o. female who complains of  left leg pain following a fall at home yesterday.  Brought to ER where exam and x-rays showed a short oblique distal left tibia fx and fxs of the 2nd and 3rd metatarsal heads. Left knee x-rays show probable old impacted distal femur fx.  Admitted for pneumonia and fx care and probable SNF care. At baseline pt reports independence with ADLs and ambulation at home with rolling walker. Her son assists with IADLs. She reports 3 falls in the last 12 months  Clinical Impression  Pt demonstrates high levels of pain which limit her participation with therapy. She requires considerable assist with all mobility and is unable to ambulate at this time. Pt will require SNF placement at discharge in order to return to full function at home. Pt will benefit from skilled PT services to address deficits in strength, balance, and mobility in order to return to full function at home.     Follow Up Recommendations SNF    Equipment Recommendations  None recommended by PT    Recommendations for Other Services       Precautions / Restrictions Precautions Precautions: Fall Restrictions Weight Bearing Restrictions: Yes LLE Weight Bearing: Non weight bearing      Mobility  Bed Mobility Overal bed mobility: Needs Assistance Bed Mobility: Supine to Sit;Sit to Supine     Supine to sit: Mod assist Sit to supine: Mod assist (+2 to scoot up to [facility])   General bed mobility comments: Pt with painful bed mobility with all AROM/PROM of LLE. Pt resists therapist initially with transfer. Care taken to protect LLE. Pt performs slowly  Transfers Overall transfer level: Needs assistance Equipment used: Rolling walker (2 wheeled) Transfers: Sit to/from Stand Sit to Stand: Mod assist         General transfer  comment: Pt requires cues for hand placement with transfer. Maintains LLE NWB during transfer. Pt with poor sequencing and poor LE power. Reports LLE pain with all attempts at transfer. Able to come to upright stance but unable to fully extens spine due to protecting LLE  Ambulation/Gait Ambulation/Gait assistance:  (Unable)              Stairs            Wheelchair Mobility    Modified Rankin (Stroke Patients Only)       Balance Overall balance assessment: Needs assistance Sitting-balance support: No upper extremity supported (R foot supported) Sitting balance-Leahy Scale: Fair     Standing balance support: Bilateral upper extremity supported Standing balance-Leahy Scale: Poor                               Pertinent Vitals/Pain Pain Assessment: 0-10 Pain Score: 8  Pain Location: L leg and foot. Pt also complains of R wrist pain at site of IV insertion    Home Living Family/patient expects to be discharged to:: Private residence Living Arrangements: Spouse/significant other Available Help at Discharge: Family (Husband having hip surgery in the next 2 weeks) Type of Home: House Home Access: Stairs to enter Entrance Stairs-Rails: Can reach both Entrance Stairs-Number of Steps: 3 Home Layout: One level Home Equipment: Walker - 2 wheels;Bedside commode;Shower seat - built in;Grab bars - toilet;Grab bars - tub/shower;Wheelchair - [employer] (Walk-in shower)  Prior Function Level of Independence: Needs assistance   Gait / Transfers Assistance Needed: Independent with rolling walker  ADL's / Homemaking Assistance Needed: Independent with ADLs, assist for IADLs from son        Hand Dominance        Extremity/Trunk Assessment   Upper Extremity Assessment: Overall WFL for tasks assessed [facility] but generalized weakness noted. Grossly 4/5 throughout)           Lower Extremity Assessment: LLE deficits/detail   LLE Deficits /  Details: RLE at least 4 to 4+/5 throughout. LLE: pt able to perform partial SLR and full LAQ without assist. Posterior splint on L lower leg. No resistance testing provided to LLE     Communication   Communication: No difficulties  Cognition Arousal/Alertness: Awake/alert Behavior During Therapy: WFL for tasks assessed/performed Overall Cognitive Status: Within Functional Limits for tasks assessed                      General Comments      Exercises        Assessment/Plan    PT Assessment Patient needs continued PT services  PT Diagnosis Generalized weakness;Acute pain;Difficulty walking   PT Problem List Decreased strength;Decreased range of motion;Decreased activity tolerance;Decreased balance;Decreased mobility;Decreased knowledge of use of DME;Pain  PT Treatment Interventions DME instruction;Gait training;Stair training;Therapeutic activities;Therapeutic exercise;Balance training;Functional mobility training;Neuromuscular re-education;Wheelchair mobility training   PT Goals (Current goals can be found in the Care Plan section) Acute Rehab PT Goals Patient Stated Goal: "I need help until I can move" PT Goal Formulation: With patient Time For Goal Achievement: 05/28/15 Potential to Achieve Goals: Good    Frequency 7X/week   Barriers to discharge        Co-evaluation               End of Session Equipment Utilized During Treatment: Gait belt Activity Tolerance: Patient limited by pain (Anxious) Patient left: in bed;with call bell/phone within reach;with bed alarm set (Pt refuses up to recliner) Nurse Communication: Other (comment) (Attempted to call but RN does not answer)         Time: 1525-1550 PT Time Calculation (min) (ACUTE ONLY): 25 min   Charges:   PT Evaluation $Initial PT Evaluation Tier I: 1 Procedure     PT G Codes:   PT G-Codes **NOT FOR INPATIENT CLASS** Functional Assessment Tool Used: Clinical Judgement Functional Limitation:  Mobility: Walking and moving around Mobility: Walking and Moving Around Current Status (H3716): At least 60 percent but less than 80 percent impaired, limited or restricted Mobility: Walking and Moving Around Goal Status [phone]): At least 20 percent but less than 40 percent impaired, limited or restricted   Lynnea Maizes PT, DPT   Huprich,Jason 05/14/2015, 4:10 PM

## 2015-05-14 NOTE — Progress Notes (Signed)
PT Hold Note  Patient Details Name: Tinzley Dalia MRN: [id number] DOB: 1941/06/12   Cancelled Treatment:    Reason Eval/Treat Not Completed: Medical issues which prohibited therapy (Awaiting orthopedic consult). Pt admitted with L tibia and multiple L metatarsal fractures. Chart reviewed and ortho consult pending. Will see patient once ortho has established and plan of care and LLE WB status.  Sharalyn Ink Daron Breeding PT, DPT   Monchel Pollitt 05/14/2015, 9:51 AM

## 2015-05-14 NOTE — Progress Notes (Signed)
Pts husband took home her 4 medications. Fluoxetine 20mg , Folic acid 1mg , Omeprazole 20mg , Diazepam 5mg .

## 2015-05-14 NOTE — Clinical Social Work Note (Signed)
Clinical Social Work Assessment  Patient Details  Name: Shirley Howell MRN: [id number] Date of Birth: 07-06-1941  Date of referral:  05/14/15               Reason for consult:  Facility Placement                Permission sought to share information with:  Family Supports Permission granted to share information::  Yes, Verbal Permission Granted  Name::     Od Dennington  Agency::     Relationship::  husband  Contact Information:  [phone]  Housing/Transportation Living arrangements for the past 2 months:  Single Family Home Source of Information:  Patient, Spouse Patient Interpreter Needed:  None Criminal Activity/Legal Involvement Pertinent to Current Situation/Hospitalization:  No - Comment as needed Significant Relationships:  Adult Children, Spouse Lives with:  Spouse, Adult Children, Self Do you feel safe going back to the place where you live?  Yes Need for family participation in patient care:  Yes (Comment)  Care giving concerns:  Pt will need assistance with ambulation due to fracture.   Social Worker assessment / plan:  Pt is a 74 y/o married female who currently lives in a private home with her husband and older son.  Pt has a younger son who lives in a house near pt's home.  Pt and husband have been married for 57 year.  Pt's husband was at bedside and very supportive.  Pt was hospitalized in June 2015 and transitioned to a SNF for STR.  Pt is agreeable to STR.  Family prefers [facility] which is close to their home.    Employment status:  Retired Health and safety inspector:  Medicare PT Recommendations:  Not assessed at this time Information / Referral to community resources:  Skilled Nursing Facility  Patient/Family's Response to care:  Pt/husband were pleasant and appreciative of CSW assistance.    Patient/Family's Understanding of and Emotional Response to Diagnosis, Current Treatment, and Prognosis:  Pt was visibly in pain and husband was helpful.  Pt was anxious to  see the orthopedic doctor and was curious about her plan of care.  CSW validated pt's concerns. Emotional Assessment Appearance:  Appears stated age Attitude/Demeanor/Rapport:  Other (uncomfortable, in pain) Affect (typically observed):  Appropriate, Pleasant, Accepting Orientation:  Oriented to Self, Oriented to Place, Oriented to  Time, Oriented to Situation Alcohol / Substance use:  Not Applicable Psych involvement (Current and /or in the community):  No (Comment)  Discharge Needs  Concerns to be addressed:  Discharge Planning Concerns Readmission within the last 30 days:  No Current discharge risk:  None Barriers to Discharge:  No Barriers Identified   Delight Stare, LCSW 05/14/2015, 11:50 AM

## 2015-05-14 NOTE — Consult Note (Signed)
ORTHOPAEDIC CONSULTATION  REQUESTING PHYSICIAN: Auburn Bilberry, MD  Chief Complaint:Left leg pain  HPI: Shirley Howell is a 74 y.o. female who complains of  Left leg pain following a fall at home yesterday.  Brought to ER where exam and x-rays showed a short oblique distal left tibia fx and fxs of the 2nd and 3rd metatarsal heads. Left knee x-rays show probable old impacted distal femur fx.  Admitted for pneumonia and fx care and probable SNF care.    Past Medical History  Diagnosis Date  . Arthritis   . Rheumatoid arthritis   . Psoriasis    Past Surgical History  Procedure Laterality Date  . Abdominal hysterectomy     History   Social History  . Marital Status: Married    Spouse Name: N/A  . Number of Children: N/A  . Years of Education: N/A   Social History Main Topics  . Smoking status: Never Smoker   . Smokeless tobacco: Not on file  . Alcohol Use: No  . Drug Use: No  . Sexual Activity: Not on file   Other Topics Concern  . None   Social History Narrative  . None   Family History  Problem Relation Age of Onset  . Heart failure Mother   . Heart failure Father    Allergies  Allergen Reactions  . Codeine Anxiety   Prior to Admission medications   Medication Sig Start Date End Date Taking? Authorizing Provider  cholecalciferol (VITAMIN D) 400 UNITS TABS tablet Take 800 Units by mouth daily.   Yes Historical Provider, MD  diazepam (VALIUM) 5 MG tablet Take 5 mg by mouth 2 (two) times daily as needed for anxiety.    Yes Historical Provider, MD  etanercept (ENBREL) 50 MG/ML injection Inject 50 mg into the skin once a week. Pt uses on Thursday.   Yes Historical Provider, MD  FLUoxetine (PROZAC) 20 MG capsule Take 20 mg by mouth at bedtime.    Yes Historical Provider, MD  folic acid (FOLVITE) 1 MG tablet Take 1 mg by mouth daily.   Yes Historical Provider, MD  morphine (MS CONTIN) 30 MG 12 hr tablet Take 30 mg by mouth 2 (two) times daily.   Yes Historical  Provider, MD  Omega-3 Fatty Acids (FISH OIL PO) Take 1 capsule by mouth daily.   Yes Historical Provider, MD  omeprazole (PRILOSEC) 20 MG capsule Take 20 mg by mouth 2 (two) times daily.    Yes Historical Provider, MD   Dg Chest 1 View  05/13/2015   CLINICAL DATA:  Cough for 2 weeks.  Shortness of breath.  EXAM: CHEST  1 VIEW  COMPARISON:  None.  FINDINGS: There is cardiomegaly. Pulmonary interstitium appears coarsened and distorted. Somewhat focal airspace opacity is identified in the right upper lung zone. No pneumothorax or pleural effusion is identified. Remote surgical neck fracture right humerus is noted.  IMPRESSION: Chronic interstitial change. Patchy airspace opacity right upper lung zone could be due to superimposed pneumonia. Recommend followup to clearing.   Electronically Signed   By: Drusilla Kanner M.D.   On: 05/13/2015 20:16   Dg Ankle Complete Left  05/13/2015   CLINICAL DATA:  Fall  EXAM: LEFT ANKLE COMPLETE - 3+ VIEW  COMPARISON:  None.  FINDINGS: Severe osteopenia. There is an oblique fracture which is minimally displaced in the distal tibia, 5 cm above the tibial plafond. There is also a subtle fracture at the base of the first metatarsal. It is ill-defined and is  across the epiphysis. There is a fracture through the base of the fifth metatarsal which traverses into its articulation with the cuboid. The edges are somewhat corticated in this is probably subacute or chronic.  IMPRESSION: Multiple fractures of indeterminate age involving the distal tibia, base of the first metatarsal, and base of the fifth metatarsal. Foot radiograph study may be helpful.  Osteopenia   Electronically Signed   By: Jolaine Click M.D.   On: 05/13/2015 13:38   Ct Femur Left Wo Contrast  05/13/2015   CLINICAL DATA:  Probable subacute or old impaction fracture of the distal left femur on radiographs earlier today. The patient hurt the left knee after falling.  EXAM: CT OF THE LEFT FEMUR WITHOUT CONTRAST   TECHNIQUE: Multidetector CT imaging was performed according to the standard protocol. Multiplanar CT image reconstructions were also generated.  COMPARISON:  Left knee radiographs obtained earlier today.  FINDINGS: Marked diffuse osteopenia. No fracture or dislocation seen. Left knee tricompartmental degenerative changes. Small to moderate-sized knee effusion. Left lateral subcutaneous edema in the distal thigh. The posterior cruciate ligament appears intact. The anterior cruciate ligament is not adequately visualized. Atheromatous arterial calcifications are noted.  IMPRESSION: 1. No fracture or dislocation seen. 2. Small to moderate-sized knee joint effusion. 3. Left knee tricompartmental degenerative changes. 4. Left lateral subcutaneous edema in the distal thigh. 5. Marked diffuse osteopenia.   Electronically Signed   By: Beckie Salts M.D.   On: 05/13/2015 17:15   Dg Knee Complete 4 Views Left  05/13/2015   CLINICAL DATA:  Status post fall today. Left knee pain. Initial encounter.  EXAM: LEFT KNEE - COMPLETE 4+ VIEW  COMPARISON:  None.  FINDINGS: There is an age-indeterminate impaction fracture of the distal femur. The fracture appears subacute to remote. No other fracture is identified. Degenerative change about the knee appears worst in medial compartment. There is no joint effusion. Patella alta is noted.  IMPRESSION: Mild impaction fracture of the distal left femur appears subacute to remote.  Osteopenia.  Osteoarthritis most notable medially.  Patella alta.   Electronically Signed   By: Drusilla Kanner M.D.   On: 05/13/2015 14:14   Dg Foot Complete Left  05/13/2015   CLINICAL DATA:  Status post fall today. Left foot pain. Initial encounter.  EXAM: LEFT FOOT - COMPLETE 3+ VIEW  COMPARISON:  None.  FINDINGS: Bones are severely osteopenic. There is a nondisplaced fracture at the base of the fifth metatarsal. Fracture of the base of first metatarsal is also identified. There may also be fractures through  the necks of the second and third metatarsals.  IMPRESSION: Nondisplaced fractures of the bases of the first and fifth metatarsals and likely fractures of the necks of the second and third metatarsals are age indeterminate.  Severe osteopenia.   Electronically Signed   By: Drusilla Kanner M.D.   On: 05/13/2015 14:12    Positive ROS: All other systems have been reviewed and were otherwise negative with the exception of those mentioned in the HPI and as above.  Physical Exam: General: Alert, no acute distress Cardiovascular: No pedal edema Respiratory: No cyanosis, no use of accessory musculature GI: No organomegaly, abdomen is soft and non-tender Skin: No lesions in the area of chief complaint Neurologic: Sensation intact distally Psychiatric: Patient is competent for consent with normal mood and affect Lymphatic: No axillary or cervical lymphadenopathy  MUSCULOSKELETAL: Left leg tender over distal tibia.  Skin and csm intact.  Left foot tender and swollen distally with  ecchmosis. Left knee minimally tender to palpation and movement.    Assessment: Left leg tibial and metatarsal fxs.  Doubt femur fx.   Plan: Splinted for comfort now.  Due to pneumonia, ORIF not indicated presently.  Will apply [facility] tomorrow and follow.    Valinda Hoar, MD [phone]   05/14/2015 2:49 PM

## 2015-05-14 NOTE — Progress Notes (Signed)
Pt. Alert and oriented. VSS. Pain controlled with IV pain meds and pt. Received zofran for nausea with relief. Incontinent of bladder. Pills whole with water.  Pt. Has hacking cough but isn't bringing anything up at this time. Pt. Has a knee guard on her right knee from earlier injury. Injured extremity is wrapped with an ace wrap. Resting quietly at this time.

## 2015-05-15 MED ORDER — MORPHINE SULFATE ER 30 MG PO TBCR: 30.0000 mg | EXTENDED_RELEASE_TABLET | Freq: Two times a day (BID) | ORAL | Status: AC

## 2015-05-15 MED ORDER — DOCUSATE SODIUM 100 MG PO CAPS: 100.0000 mg | ORAL_CAPSULE | Freq: Two times a day (BID) | ORAL | Status: AC

## 2015-05-15 MED ORDER — ACETAMINOPHEN 325 MG PO TABS: 650.0000 mg | ORAL_TABLET | Freq: Four times a day (QID) | ORAL | Status: AC | PRN

## 2015-05-15 MED ORDER — LEVOFLOXACIN 750 MG PO TABS: 750.0000 mg | ORAL_TABLET | Freq: Every day | ORAL | Status: AC

## 2015-05-15 MED ORDER — MORPHINE SULFATE 15 MG PO TABS: 15.0000 mg | ORAL_TABLET | ORAL | Status: AC | PRN

## 2015-05-15 MED ORDER — ENOXAPARIN SODIUM 40 MG/0.4ML ~~LOC~~ SOLN: 40.0000 mg | SUBCUTANEOUS | Status: AC

## 2015-05-15 MED ORDER — PHENOL 1.4 % MT LIQD: 1.0000 | OROMUCOSAL | Status: AC | PRN

## 2015-05-15 MED ORDER — PHENOL 1.4 % MT LIQD: 1.0000 | OROMUCOSAL | Status: DC | PRN

## 2015-05-15 NOTE — Discharge Summary (Signed)
[address], 74 y.o., DOB 07/02/1941, MRN [id number]. Admission date: 05/13/2015 Discharge Date 05/15/2015 Primary MD Thea Alken, MD Admitting Physician Houston Siren, MD  Admission Diagnosis  Weakness [R53.1] Pain [R52] UTI (lower urinary tract infection) [N39.0] Metatarsal fracture, unspecified laterality, closed, initial encounter [S92.309A] Tibia fracture, left, closed, initial encounter [S82.202A] Pain and swelling of left lower extremity [M79.605, M79.89]  Discharge Diagnosis   Active Problems:   Tibial fracture  community aquired pna  OA RA osiruasis        Hospital Course  Shirley Howell is a 74 y.o. female with a known history of osteoarthritis, rheumatoid arthritis, psoriasis presented to the hospital after suffering a mechanical fall in her bedroom earlier today and was having significant left lower extremity pain and therefore came to the ER for further evaluation. In the emergency room patient underwent x-ray of her left leg including a CT scan which showed evidence of a left-sided tibial fracture, multiple left foot metatarsal fractures. Incidentally patient has also had a cough with a productive sputum which is green and yellow in color. Patient has been on a Z-Pak and finished it about a week ago without much improvement in his symptoms. On x-ray patient is noted to have [place] superimposed pneumonia. She was admited to hospital and started on iv abx. For her foot fx seen by ortho Dr. Hyacinth Meeker who recommend cast and non weight being status, pt will be seen by him in the office in 10 days. She feels better and denies any complaints. She will need rehab      Consults  orthopedic surgery  Significant Tests:  See full reports for all details    Dg Chest 1 View  05/13/2015   CLINICAL DATA:  Cough for 2 weeks.  Shortness of breath.  EXAM: CHEST  1 VIEW  COMPARISON:  None.  FINDINGS: There is cardiomegaly. Pulmonary interstitium appears coarsened and distorted.  Somewhat focal airspace opacity is identified in the right upper lung zone. No pneumothorax or pleural effusion is identified. Remote surgical neck fracture right humerus is noted.  IMPRESSION: Chronic interstitial change. Patchy airspace opacity right upper lung zone could be due to superimposed pneumonia. Recommend followup to clearing.   Electronically Signed   By: Drusilla Kanner M.D.   On: 05/13/2015 20:16   Dg Ankle Complete Left  05/13/2015   CLINICAL DATA:  Fall  EXAM: LEFT ANKLE COMPLETE - 3+ VIEW  COMPARISON:  None.  FINDINGS: Severe osteopenia. There is an oblique fracture which is minimally displaced in the distal tibia, 5 cm above the tibial plafond. There is also a subtle fracture at the base of the first metatarsal. It is ill-defined and is across the epiphysis. There is a fracture through the base of the fifth metatarsal which traverses into its articulation with the cuboid. The edges are somewhat corticated in this is probably subacute or chronic.  IMPRESSION: Multiple fractures of indeterminate age involving the distal tibia, base of the first metatarsal, and base of the fifth metatarsal. Foot radiograph study may be helpful.  Osteopenia   Electronically Signed   By: Jolaine Click M.D.   On: 05/13/2015 13:38   Ct Femur Left Wo Contrast  05/13/2015   CLINICAL DATA:  Probable subacute or old impaction fracture of the distal left femur on radiographs earlier today. The patient hurt the left knee after falling.  EXAM: CT OF THE LEFT FEMUR WITHOUT CONTRAST  TECHNIQUE: Multidetector CT imaging was performed according to the standard protocol. Multiplanar CT  image reconstructions were also generated.  COMPARISON:  Left knee radiographs obtained earlier today.  FINDINGS: Marked diffuse osteopenia. No fracture or dislocation seen. Left knee tricompartmental degenerative changes. Small to moderate-sized knee effusion. Left lateral subcutaneous edema in the distal thigh. The posterior cruciate ligament  appears intact. The anterior cruciate ligament is not adequately visualized. Atheromatous arterial calcifications are noted.  IMPRESSION: 1. No fracture or dislocation seen. 2. Small to moderate-sized knee joint effusion. 3. Left knee tricompartmental degenerative changes. 4. Left lateral subcutaneous edema in the distal thigh. 5. Marked diffuse osteopenia.   Electronically Signed   By: Beckie Salts M.D.   On: 05/13/2015 17:15   Dg Knee Complete 4 Views Left  05/13/2015   CLINICAL DATA:  Status post fall today. Left knee pain. Initial encounter.  EXAM: LEFT KNEE - COMPLETE 4+ VIEW  COMPARISON:  None.  FINDINGS: There is an age-indeterminate impaction fracture of the distal femur. The fracture appears subacute to remote. No other fracture is identified. Degenerative change about the knee appears worst in medial compartment. There is no joint effusion. Patella alta is noted.  IMPRESSION: Mild impaction fracture of the distal left femur appears subacute to remote.  Osteopenia.  Osteoarthritis most notable medially.  Patella alta.   Electronically Signed   By: Drusilla Kanner M.D.   On: 05/13/2015 14:14   Dg Foot Complete Left  05/13/2015   CLINICAL DATA:  Status post fall today. Left foot pain. Initial encounter.  EXAM: LEFT FOOT - COMPLETE 3+ VIEW  COMPARISON:  None.  FINDINGS: Bones are severely osteopenic. There is a nondisplaced fracture at the base of the fifth metatarsal. Fracture of the base of first metatarsal is also identified. There may also be fractures through the necks of the second and third metatarsals.  IMPRESSION: Nondisplaced fractures of the bases of the first and fifth metatarsals and likely fractures of the necks of the second and third metatarsals are age indeterminate.  Severe osteopenia.   Electronically Signed   By: Drusilla Kanner M.D.   On: 05/13/2015 14:12       Today   Subjective:   Shirley Howell  Feels better, cough improved, had cast placed earlier.   Objective:    Blood pressure 147/71, pulse 72, temperature 98.2 F (36.8 C), temperature source Oral, resp. rate 18, height 5' (1.524 m), weight 80.287 kg (177 lb), SpO2 95 %.  .  Intake/Output Summary (Last 24 hours) at 05/15/15 0857 Last data filed at 05/14/15 1327  Gross per 24 hour  Intake    360 ml  Output      0 ml  Net    360 ml    Exam VITAL SIGNS: Blood pressure 147/71, pulse 72, temperature 98.2 F (36.8 C), temperature source Oral, resp. rate 18, height 5' (1.524 m), weight 80.287 kg (177 lb), SpO2 95 %.  GENERAL:  74 y.o.-year-old patient lying in the bed with no acute distress.  EYES: Pupils equal, round, reactive to light and accommodation. No scleral icterus. Extraocular muscles intact.  HEENT: Head atraumatic, normocephalic. Oropharynx and nasopharynx clear.  NECK:  Supple, no jugular venous distention. No thyroid enlargement, no tenderness.  LUNGS: Normal breath sounds bilaterally, no wheezing, rales,rhonchi or crepitation. No use of accessory muscles of respiration.  CARDIOVASCULAR: S1, S2 normal. No murmurs, rubs, or gallops.  ABDOMEN: Soft, nontender, nondistended. Bowel sounds present. No organomegaly or mass.  EXTREMITIES: No pedal edema, cyanosis, or clubbing. Cast on left leg NEUROLOGIC: Cranial nerves II through XII are  intact. Muscle strength 5/5 in all extremities. Sensation intact. Gait not checked.  PSYCHIATRIC: The patient is alert and oriented x 3.  SKIN: No obvious rash, lesion, or ulcer.   Data Review     CBC w Diff: Lab Results  Component Value Date   WBC 8.7 05/14/2015   HGB 11.2* 05/14/2015   HCT 34.5* 05/14/2015   PLT 222 05/14/2015   LYMPHOPCT 27 05/13/2015   MONOPCT 10 05/13/2015   EOSPCT 4 05/13/2015   BASOPCT 1 05/13/2015   CMP: Lab Results  Component Value Date   NA 138 05/14/2015   K 4.3 05/14/2015   CL 109 05/14/2015   CO2 24 05/14/2015   BUN 14 05/14/2015   CREATININE 0.83 05/14/2015   PROT 7.1 05/13/2015   ALBUMIN 3.3* 05/13/2015    BILITOT 0.5 05/13/2015   ALKPHOS 132* 05/13/2015   AST 28 05/13/2015   ALT 20 05/13/2015  .  Micro Results No results found for this or any previous visit (from the past 240 hour(s)).      Code Status Orders        Start     Ordered   05/13/15 2159  Full code   Continuous     05/13/15 2158    Advance Directive Documentation        Most Recent Value   Type of Advance Directive  Healthcare Power of Attorney, Living will   Pre-existing out of facility DNR order (yellow form or pink MOST form)     "MOST" Form in Place?            Follow-up Information    Follow up with MILLER,HOWARD E, MD In 10 days.   Specialty:  Specialist   Contact information:   [address] [place] [address] [phone]       Discharge Medications     Medication List    TAKE these medications        acetaminophen 325 MG tablet  Commonly known as:  TYLENOL  Take 2 tablets (650 mg total) by mouth every 6 (six) hours as needed for mild pain (or Fever >/= 101).     cholecalciferol 400 UNITS Tabs tablet  Commonly known as:  VITAMIN D  Take 800 Units by mouth daily.     diazepam 5 MG tablet  Commonly known as:  VALIUM  Take 5 mg by mouth 2 (two) times daily as needed for anxiety.     docusate sodium 100 MG capsule  Commonly known as:  COLACE  Take 1 capsule (100 mg total) by mouth 2 (two) times daily.     enoxaparin 40 MG/0.4ML injection  Commonly known as:  LOVENOX  Inject 0.4 mLs (40 mg total) into the skin daily.     etanercept 50 MG/ML injection  Commonly known as:  ENBREL  Inject 50 mg into the skin once a week. Pt uses on Thursday.     FISH OIL PO  Take 1 capsule by mouth daily.     FLUoxetine 20 MG capsule  Commonly known as:  PROZAC  Take 20 mg by mouth at bedtime.     folic acid 1 MG tablet  Commonly known as:  FOLVITE  Take 1 mg by mouth daily.     levofloxacin 750 MG tablet  Commonly known as:  LEVAQUIN  Take 1 tablet (750 mg total) by mouth  daily.     morphine 30 MG 12 hr tablet  Commonly known as:  MS CONTIN  Take 1 tablet (30 mg total) by mouth 2 (two) times daily.     morphine 15 MG tablet  Commonly known as:  MSIR  Take 1 tablet (15 mg total) by mouth every 4 (four) hours as needed for severe pain.     omeprazole 20 MG capsule  Commonly known as:  PRILOSEC  Take 20 mg by mouth 2 (two) times daily.     phenol 1.4 % Liqd  Commonly known as:  CHLORASEPTIC  Use as directed 1 spray in the mouth or throat as needed for throat irritation / pain.           Total Time in preparing paper work, data evaluation and todays exam - 35 minutes  Auburn Bilberry M.D on 05/15/2015 at 8:57 AM  [facility]   Office  [phone]

## 2015-05-15 NOTE — Progress Notes (Signed)
Pt. Alert and oriented. VSS. Pain controlled with IV pain meds. Pills whole with water. Bed and bath completed. Incontinent of urine. Resting quietly at this time.

## 2015-05-15 NOTE — Discharge Instructions (Signed)
°  DIET:  Regular diet  DISCHARGE CONDITION:  Stable  ACTIVITY:  Left lower extremity non weight bearing, physical therapy eval and treat  OXYGEN:  Home Oxygen: No.   Oxygen Delivery: room air  DISCHARGE LOCATION:  nursing home    ADDITIONAL DISCHARGE INSTRUCTION:   If you experience worsening of your admission symptoms, develop shortness of breath, life threatening emergency, suicidal or homicidal thoughts you must seek medical attention immediately by calling 911 or calling your MD immediately  if symptoms less severe.  You Must read complete instructions/literature along with all the possible adverse reactions/side effects for all the Medicines you take and that have been prescribed to you. Take any new Medicines after you have completely understood and accpet all the possible adverse reactions/side effects.   Please note  You were cared for by a hospitalist during your hospital stay. If you have any questions about your discharge medications or the care you received while you were in the hospital after you are discharged, you can call the unit and asked to speak with the hospitalist on call if the hospitalist that took care of you is not available. Once you are discharged, your primary care physician will handle any further medical issues. Please note that NO REFILLS for any discharge medications will be authorized once you are discharged, as it is imperative that you return to your primary care physician (or establish a relationship with a primary care physician if you do not have one) for your aftercare needs so that they can reassess your need for medications and monitor your lab values.

## 2015-05-15 NOTE — Progress Notes (Signed)
Discharge note:  Patient discharge to [facility] [place], report called to [place] at facility. EMS called. Waiting on transportation. Continue to monitor.

## 2015-05-15 NOTE — Clinical Social Work Placement (Signed)
   CLINICAL SOCIAL WORK PLACEMENT  NOTE  Date:  05/15/2015  Patient Details  Name: Shirley Howell MRN: [id number] Date of Birth: 02-05-1941  Clinical Social Work is seeking post-discharge placement for this patient at the Skilled  Nursing Facility level of care (*CSW will initial, date and re-position this form in  chart as items are completed):  Yes   Patient/family provided with [place] Clinical Social Work Department's list of facilities offering this level of care within the geographic area requested by the patient (or if unable, by the patient's family).  Yes   Patient/family informed of their freedom to choose among providers that offer the needed level of care, that participate in Medicare, Medicaid or managed care program needed by the patient, have an available bed and are willing to accept the patient.  Yes   Patient/family informed of [facility]'s ownership interest in [facility] and [facility], as well as of the fact that they are under no obligation to receive care at these facilities.  PASRR submitted to EDS on       PASRR number received on       Existing PASRR number confirmed on 05/14/15     FL2 transmitted to all facilities in geographic area requested by pt/family on 05/14/15     FL2 transmitted to all facilities within larger geographic area on       Patient informed that his/her managed care company has contracts with or will negotiate with certain facilities, including the following:        Yes   Patient/family informed of bed offers received.  Patient chooses bed at [facility]     Physician recommends and patient chooses bed at      Patient to be transferred to  [facility] ) on 05/15/15.  Patient to be transferred to facility by  [facility] )     Patient family notified on 05/15/15 of transfer.  Name of family member notified:   (Patient's husband was at bedside. )     PHYSICIAN       Additional  Comment:    _______________________________________________ Haig Prophet, LCSW 05/15/2015, 10:11 AM

## 2015-05-15 NOTE — Progress Notes (Signed)
Patient is medically stable for D/C to [facility] today. Per [facility] admissions coordinator at [facility] patient is going to room E-15. RN will call report and arrange EMS for transport. Clinical Child psychotherapist (CSW) prepared D/C packet and sent D/C summary and Ortho progress note to [place] via [facility]. Patient's husband is at bedside and aware of above. Please reconsult if future social work needs arise. CSW signing off.   Jetta Lout, LCSWA [phone]

## 2015-05-15 NOTE — Progress Notes (Signed)
Spoke with Shirley Howell, [facility] rep at [phone], to notify of non-emergent EMS transport.  Auth notification reference given as [id number].   Service date range good from 05/15/15 - 08/13/15.   Gap exception requested to determine if services can be considered at an in-network level.

## 2015-06-15 LAB — EXPECTORATED SPUTUM ASSESSMENT W GRAM STAIN, RFLX TO RESP C

## 2015-06-15 LAB — EXPECTORATED SPUTUM ASSESSMENT W REFEX TO RESP CULTURE

## 2016-01-11 IMAGING — CR DG FOOT COMPLETE 3+V*L*
1 series · 4 of 4 positions shown · non-contrast
Comparison: None.

CLINICAL DATA: Status post fall today. Left foot pain. Initial
encounter.

EXAM:
LEFT FOOT - COMPLETE 3+ VIEW

[Series 1: ap · 0.17mm/px · 4 of 4 slices shown]
[im 1/4]
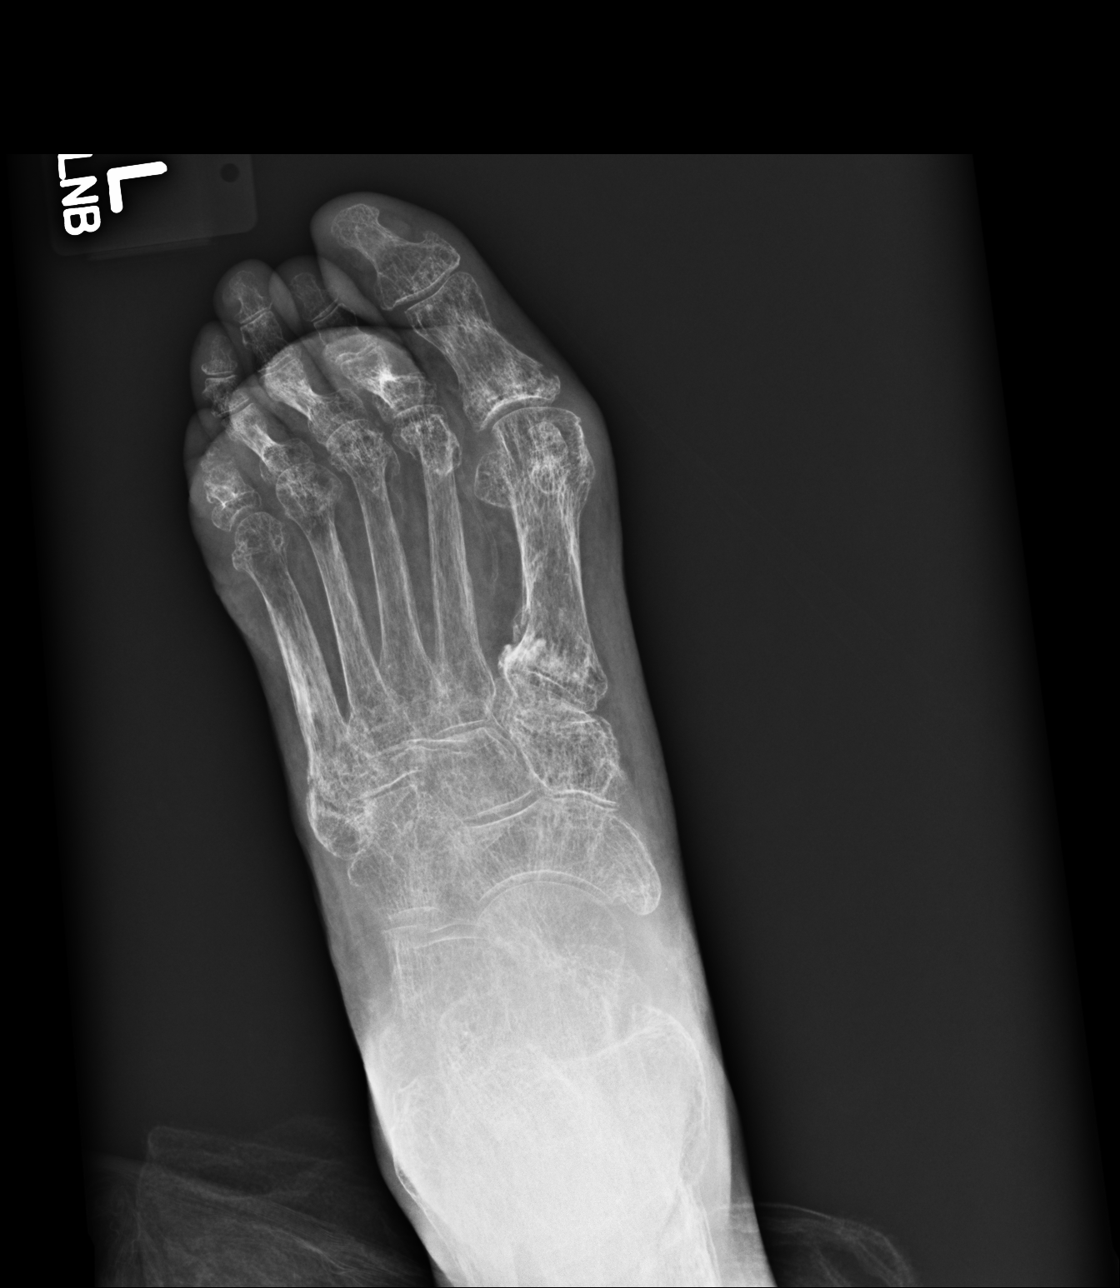
[im 2/4]
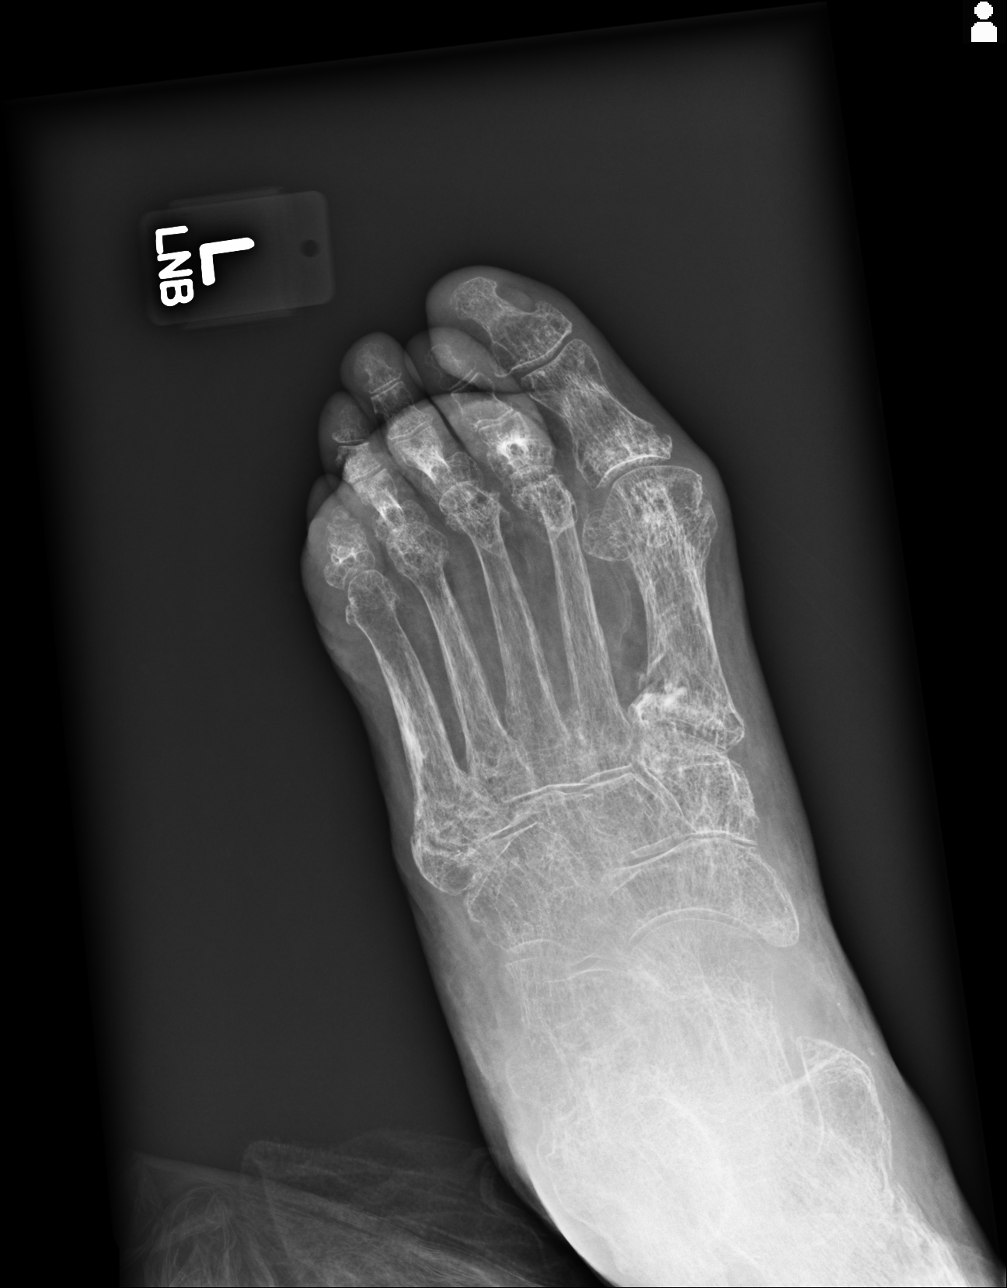
[im 3/4]
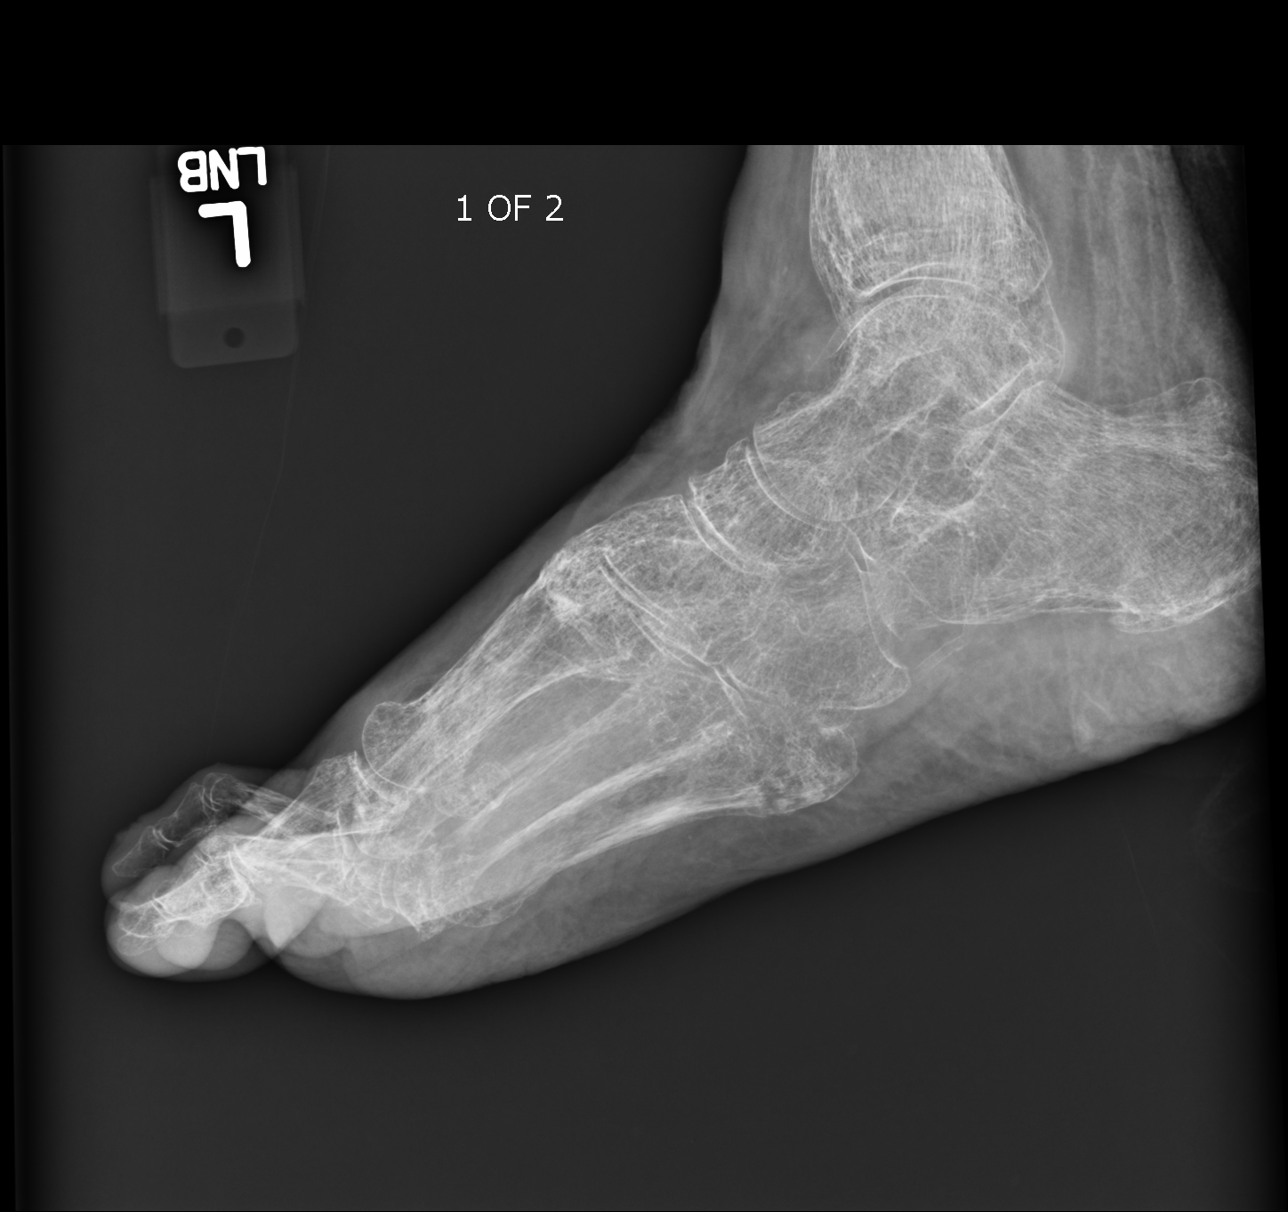
[im 4/4]
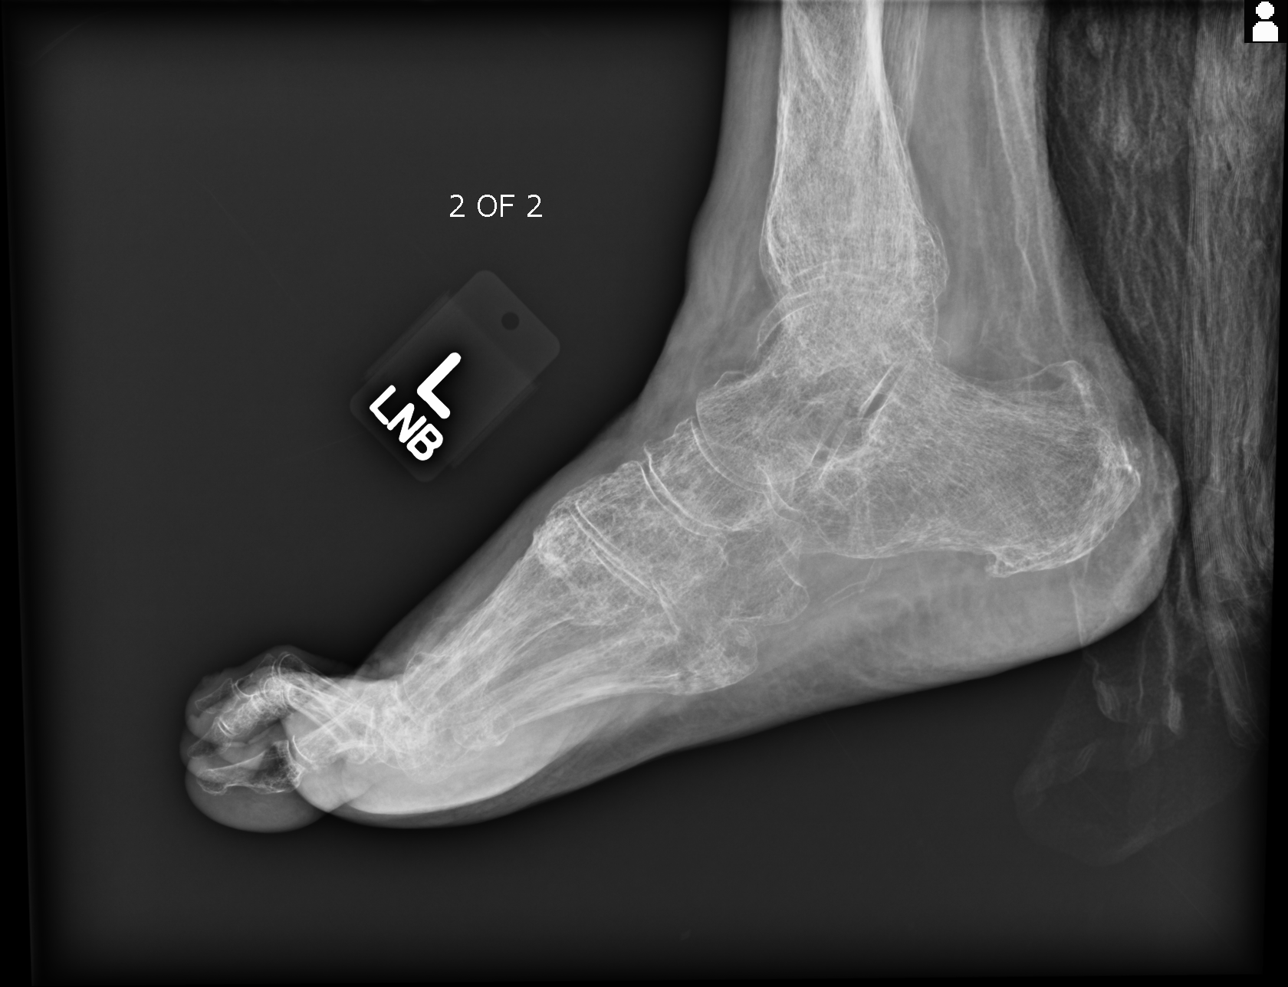

[4 of 4 positions shown; findings below may reference images not displayed]

FINDINGS: Bones are severely osteopenic. There is a nondisplaced fracture at
the base of the fifth metatarsal. Fracture of the base of first
metatarsal is also identified. There may also be fractures through
the necks of the second and third metatarsals.
IMPRESSION: Nondisplaced fractures of the bases of the first and fifth
metatarsals and likely fractures of the necks of the second and
third metatarsals are age indeterminate.

Severe osteopenia.

## 2019-08-25 DEATH — deceased
